# Patient Record
Sex: Male | Born: 2000 | Race: White | Hispanic: No | Marital: Married | Smoking: Current some day smoker
Health system: Southern US, Community
[De-identification: ages and names within clinical notes are randomized; demographics above are authoritative.]

## PROBLEM LIST (undated history)

## (undated) DIAGNOSIS — F419 Anxiety disorder, unspecified: Secondary | ICD-10-CM

## (undated) DIAGNOSIS — F319 Bipolar disorder, unspecified: Secondary | ICD-10-CM

## (undated) DIAGNOSIS — F909 Attention-deficit hyperactivity disorder, unspecified type: Secondary | ICD-10-CM

## (undated) DIAGNOSIS — F32A Depression, unspecified: Secondary | ICD-10-CM

## (undated) DIAGNOSIS — F988 Other specified behavioral and emotional disorders with onset usually occurring in childhood and adolescence: Secondary | ICD-10-CM

## (undated) HISTORY — DX: Depression, unspecified: F32.A

## (undated) HISTORY — DX: Bipolar disorder, unspecified: F31.9

## (undated) HISTORY — DX: Anxiety disorder, unspecified: F41.9

## (undated) HISTORY — DX: Attention-deficit hyperactivity disorder, unspecified type: F90.9

---
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
## 2021-11-04 ENCOUNTER — Encounter: Payer: Self-pay | Admitting: Family Medicine

## 2021-11-04 ENCOUNTER — Ambulatory Visit: Payer: BC Managed Care – PPO | Admitting: Family Medicine

## 2021-11-04 VITALS — BP 128/72 | HR 67 | Temp 99.4°F | Ht 68.0 in | Wt 145.8 lb

## 2021-11-04 DIAGNOSIS — Z0000 Encounter for general adult medical examination without abnormal findings: Secondary | ICD-10-CM

## 2021-11-04 DIAGNOSIS — Z0001 Encounter for general adult medical examination with abnormal findings: Secondary | ICD-10-CM | POA: Diagnosis not present

## 2021-11-04 DIAGNOSIS — F331 Major depressive disorder, recurrent, moderate: Secondary | ICD-10-CM | POA: Diagnosis not present

## 2021-11-04 MED ORDER — QUETIAPINE FUMARATE 25 MG PO TABS: ORAL_TABLET | ORAL | 0 refills | Status: DC
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
## 2021-11-05 ENCOUNTER — Telehealth: Payer: Self-pay | Admitting: Family Medicine

## 2021-11-05 NOTE — Telephone Encounter (Signed)
This request has received a Favorable outcome from [facility].  Please keep in mind this is not a guarantee of payment. Eligibility and Benefit determinations will be made at the time of service.  Please note any additional information provided by [facility] at the bottom of the screen. 

## 2021-11-22 ENCOUNTER — Telehealth: Payer: Self-pay | Admitting: Family Medicine

## 2021-11-22 NOTE — Telephone Encounter (Signed)
°  Prescription Request  11/22/2021  Is this a "Controlled Substance" medicine? yes  Have you seen your PCP in the last 2 weeks? 11/04/2021  If YES, route message to pool  -  If NO, patient needs to be scheduled for appointment.  What is the name of the medication or equipment? Pt said that dr stacks started him on seroquil 25mg  and was supposed to increase to 150 mg at next refill. He was suppose to try for 3 weeks and it is working  Have you contacted your pharmacy to request a refill? yes   Which pharmacy would you like this sent to? [facility]   Patient notified that their request is being sent to the clinical staff for review and that they should receive a response within 2 business days.

## 2021-11-24 ENCOUNTER — Ambulatory Visit: Payer: Self-pay | Admitting: Family Medicine

## 2021-11-25 ENCOUNTER — Ambulatory Visit: Payer: BC Managed Care – PPO | Admitting: Family Medicine

## 2021-11-26 ENCOUNTER — Encounter: Payer: Self-pay | Admitting: Family Medicine

## 2021-11-26 ENCOUNTER — Telehealth: Payer: Self-pay | Admitting: Family Medicine

## 2021-11-26 NOTE — Telephone Encounter (Signed)
Pt called in to dispute his appointment date.  Says he was supposed to come in on the 3rd of February.   Phone call was recorded and we listened back to the date the call was made and patient was told to come in on Thursday, Feb 2nd. Patient called back again and made another appt for next week.

## 2021-11-29 ENCOUNTER — Ambulatory Visit: Payer: Self-pay | Admitting: Family Medicine

## 2021-11-29 ENCOUNTER — Encounter: Payer: Self-pay | Admitting: Family Medicine

## 2021-11-29 VITALS — BP 128/70 | HR 84 | Temp 98.6°F | Ht 68.0 in | Wt 154.8 lb

## 2021-11-29 DIAGNOSIS — F902 Attention-deficit hyperactivity disorder, combined type: Secondary | ICD-10-CM

## 2021-11-29 DIAGNOSIS — F331 Major depressive disorder, recurrent, moderate: Secondary | ICD-10-CM

## 2021-11-29 MED ORDER — LISDEXAMFETAMINE DIMESYLATE 20 MG PO CAPS: 20.0000 mg | ORAL_CAPSULE | ORAL | 0 refills | Status: DC

## 2021-11-29 MED ORDER — QUETIAPINE FUMARATE 200 MG PO TABS
200.0000 mg | ORAL_TABLET | Freq: Every day | ORAL | 3 refills | Status: DC
Start: 2021-11-29 — End: 2024-08-21

## 2021-11-29 NOTE — Progress Notes (Signed)
Subjective:  Patient ID: Drew Robertson, male    DOB: [birth year]  Age: 21 y.o. MRN: [id number]  CC: Medication Refill   HPI PIER BOSHER presents for follow up of depression. Handling things better. No acting out. Wife kicked him out 2 weeks ago. He didn't react impulsively like he would have in the past. Four days ago had conflict at [facility]. Handled it without conflict. Unusual for him.   Still easily distracted and hyperactive. At work can be distracted by activity around him. Has fidgety behaviors. Had taken med for ADHD as a child.   Depression screen [employer] 2/9 11/29/2021 11/29/2021 11/04/2021  Decreased Interest 1 0 1  Down, Depressed, Hopeless 0 0 1  PHQ - 2 Score 1 0 2  Altered sleeping 0 - 1  Tired, decreased energy 0 - 1  Change in appetite 0 - 3  Feeling bad or failure about yourself  0 - 1  Trouble concentrating 3 - 3  Moving slowly or fidgety/restless 3 - 3  Suicidal thoughts 0 - 0  PHQ-9 Score 7 - 14  Difficult doing work/chores Somewhat difficult - Very difficult    History Kleber has a past medical history of ADD (attention deficit disorder), ADHD, Anxiety, Bipolar depression (HCC), and Depression.   He has no past surgical history on file.   His family history includes Anxiety disorder in his father, mother, sister, and sister; Cancer in his paternal grandfather; Dementia in his paternal grandmother; Depression in his father, mother, sister, and sister; Leukemia in his paternal grandmother.He reports that he has been smoking cigarettes. He has been smoking an average of .5 packs per day. He has never used smokeless tobacco. He reports current alcohol use. He reports current drug use. Drug: Marijuana.    ROS Review of Systems  Constitutional:  Negative for fever.  Respiratory:  Negative for shortness of breath.   Cardiovascular:  Negative for chest pain.  Musculoskeletal:  Negative for arthralgias.  Skin:  Negative for rash.   Objective:  BP  128/70    Pulse 84    Temp 98.6 F (37 C)    Ht 5\' 8"  (1.727 m)    Wt 154 lb 12.8 oz (70.2 kg)    SpO2 99%    BMI 23.54 kg/m   BP Readings from Last 3 Encounters:  11/29/21 128/70  11/04/21 128/72  04/25/16 98/62 (3 %, Z = -1.88 /  28 %, Z = -0.58)*   *BP percentiles are based on the 2017 AAP Clinical Practice Guideline for boys    Wt Readings from Last 3 Encounters:  11/29/21 154 lb 12.8 oz (70.2 kg)  11/04/21 145 lb 12.8 oz (66.1 kg)  04/22/16 135 lb (61.2 kg) (34 %, Z= -0.42)*   * Growth percentiles are based on CDC (Boys, 2-20 Years) data.     Physical Exam Vitals reviewed.  Constitutional:      Appearance: He is well-developed.  HENT:     Head: Normocephalic and atraumatic.     Right Ear: External ear normal.     Left Ear: External ear normal.     Mouth/Throat:     Pharynx: No oropharyngeal exudate or posterior oropharyngeal erythema.  Eyes:     Pupils: Pupils are equal, round, and reactive to light.  Cardiovascular:     Rate and Rhythm: Normal rate and regular rhythm.     Heart sounds: No murmur heard. Pulmonary:     Effort: No respiratory distress.  Breath sounds: Normal breath sounds.  Musculoskeletal:     Cervical back: Normal range of motion and neck supple.  Neurological:     Mental Status: He is alert and oriented to person, place, and time.      Assessment & Plan:   Tacuma was seen today for medication refill.  Diagnoses and all orders for this visit:  Moderate episode of recurrent major depressive disorder (HCC)  Attention deficit hyperactivity disorder (ADHD), combined type  Other orders -     QUEtiapine (SEROQUEL) 200 MG tablet; Take 1 tablet (200 mg total) by mouth at bedtime. -     lisdexamfetamine (VYVANSE) 20 MG capsule; Take 1 capsule (20 mg total) by mouth every morning.       I have changed Jude B. Pizzimenti "Blaine"'s QUEtiapine. I am also having him start on lisdexamfetamine.  Allergies as of 11/29/2021        Reactions   Concerta [methylphenidate]    Strattera [atomoxetine]    Zoloft [sertraline]         Medication List        Accurate as of November 29, 2021  1:35 PM. If you have any questions, ask your nurse or doctor.          lisdexamfetamine 20 MG capsule Commonly known as: Vyvanse Take 1 capsule (20 mg total) by mouth every morning. Started by: Mechele Claude, MD   QUEtiapine 200 MG tablet Commonly known as: SEROQUEL Take 1 tablet (200 mg total) by mouth at bedtime. What changed:  medication strength See the new instructions. Changed by: Mechele Claude, MD         Follow-up: Return in about 1 month (around 12/27/2021).  Mechele Claude, M.D.

## 2021-11-30 ENCOUNTER — Telehealth: Payer: Self-pay | Admitting: Family Medicine

## 2021-11-30 ENCOUNTER — Ambulatory Visit: Payer: BC Managed Care – PPO | Admitting: Family Medicine

## 2021-11-30 NOTE — Telephone Encounter (Signed)
This is a controlled medication and will have to wait for Dr. Darlyn Read.

## 2021-12-03 ENCOUNTER — Other Ambulatory Visit: Payer: Self-pay | Admitting: Family Medicine

## 2021-12-03 MED ORDER — METHYLPHENIDATE HCL 10 MG PO TABS: 10.0000 mg | ORAL_TABLET | Freq: Two times a day (BID) | ORAL | 0 refills | Status: DC

## 2021-12-03 NOTE — Telephone Encounter (Signed)
Patient aware.

## 2021-12-03 NOTE — Telephone Encounter (Signed)
Please let the patient know that I sent their prescription to their pharmacy. Thanks, WS 

## 2021-12-27 ENCOUNTER — Ambulatory Visit: Payer: Self-pay | Admitting: Family Medicine

## 2021-12-28 ENCOUNTER — Encounter: Payer: Self-pay | Admitting: Family Medicine

## 2022-11-11 ENCOUNTER — Ambulatory Visit (INDEPENDENT_AMBULATORY_CARE_PROVIDER_SITE_OTHER): Payer: 59 | Admitting: Family

## 2022-11-11 ENCOUNTER — Encounter: Payer: Self-pay | Admitting: Family

## 2022-11-11 VITALS — BP 129/78 | HR 64 | Temp 97.9°F | Ht 69.0 in | Wt 160.4 lb

## 2022-11-11 DIAGNOSIS — M6740 Ganglion, unspecified site: Secondary | ICD-10-CM

## 2022-11-11 MED ORDER — DICLOFENAC SODIUM 75 MG PO TBEC: 75.0000 mg | DELAYED_RELEASE_TABLET | Freq: Two times a day (BID) | ORAL | 0 refills | Status: DC

## 2022-11-11 NOTE — Patient Instructions (Signed)
Ganglion Cyst  A ganglion cyst is a non-cancerous, fluid-filled lump of tissue that occurs near a joint, tendon, or ligament. The cyst grows out of a joint or the lining of a tendon or ligament. Ganglion cysts most often develop in the hand or wrist, but they can also develop in the shoulder, elbow, hip, knee, ankle, or foot. Ganglion cysts are ball-shaped or egg-shaped. Their size can range from the size of a pea to larger than a grape. Increased activity may cause the cyst to get bigger because more fluid starts to build up. What are the causes? The exact cause of this condition is not known, but it may be related to: Inflammation or irritation around the joint. An injury or tear in the layers of tissue around the joint (joint capsule). Repetitive movements or overuse. History of acute or repeated injury. What increases the risk? You are more likely to develop this condition if: You are a male. You are 74-36 years old. What are the signs or symptoms? The main symptom of this condition is a lump. It most often appears on the hand or wrist. In many cases, there are no other symptoms, but a cyst can sometimes cause: Tingling. Pain or tenderness. Numbness. Weakness or loss of strength in the affected joint. Decreased range of motion in the affected area of the body. How is this diagnosed? Ganglion cysts are usually diagnosed based on a physical exam. Your health care provider will feel the lump and may shine a light next to it. If it is a ganglion cyst, the light will likely shine through it. Your health care provider may order an X-ray, ultrasound, MRI, or CT scan to rule out other conditions. How is this treated? Ganglion cysts often go away on their own without treatment. If you have pain or other symptoms, treatment may be needed. Treatment is also needed if the ganglion cyst limits your movement or if it gets infected. Treatment may include: Wearing a brace or splint on your wrist or  finger. Taking anti-inflammatory medicine. Having fluid drained from the lump with a needle (aspiration). Getting an injection of medicine into the joint to decrease inflammation. This may be corticosteroids, ethanol, or hyaluronidase. Having surgery to remove the ganglion cyst. Placing a pad in your shoe or wearing shoes that will not rub against the cyst if it is on your foot. Follow these instructions at home: Do not press on the ganglion cyst, poke it with a needle, or hit it. Take over-the-counter and prescription medicines only as told by your health care provider. If you have a brace or splint: Wear it as told by your health care provider. Remove it as told by your health care provider. Ask if you need to remove it when you take a shower or a bath. Watch your ganglion cyst for any changes. Keep all follow-up visits as told by your health care provider. This is important. Contact a health care provider if: Your ganglion cyst becomes larger or more painful. You have pus coming from the lump. You have weakness or numbness in the affected area. You have a fever or chills. Get help right away if: You have a fever and have any of these in the cyst area: Increased redness. Red streaks. Swelling. Summary A ganglion cyst is a non-cancerous, fluid-filled lump that occurs near a joint, tendon, or ligament. Ganglion cysts most often develop in the hand or wrist, but they can also develop in the shoulder, elbow, hip, knee, ankle, or  foot. Ganglion cysts often go away on their own without treatment. This information is not intended to replace advice given to you by your health care provider. Make sure you discuss any questions you have with your health care provider. Document Revised: 01/01/2020 Document Reviewed: 01/01/2020 Elsevier Patient Education  [place].

## 2022-11-11 NOTE — Progress Notes (Signed)
   Subjective:    Patient ID: Drew Robertson, male    DOB: 13-Jan-2001, 22 y.o.   MRN: [id number]  Chief Complaint  Patient presents with   Cyst    Right arm noticed about 2 weeks. No injury. Tender to touch     HPI Pt presents to the office today with a cyst on his right wrist that he noticed three weeks ago. Denies any pain, but mild tenderness when he pushes or moves it around. Denies any injury, fever, or erythemas.   Report he believes it has become slightly larger.    Review of Systems  All other systems reviewed and are negative.      Objective:   Physical Exam Vitals reviewed.  Constitutional:      General: He is not in acute distress.    Appearance: He is well-developed.  HENT:     Head: Normocephalic.  Eyes:     General:        Right eye: No discharge.        Left eye: No discharge.     Pupils: Pupils are equal, round, and reactive to light.  Neck:     Thyroid: No thyromegaly.  Cardiovascular:     Rate and Rhythm: Normal rate and regular rhythm.     Heart sounds: Normal heart sounds. No murmur heard. Pulmonary:     Effort: Pulmonary effort is normal. No respiratory distress.     Breath sounds: Normal breath sounds. No wheezing.  Abdominal:     General: Bowel sounds are normal. There is no distension.     Palpations: Abdomen is soft.     Tenderness: There is no abdominal tenderness.  Musculoskeletal:        General: No tenderness. Normal range of motion.       Arms:     Cervical back: Normal range of motion and neck supple.     Comments: Small movable cyst on right wrist   Skin:    General: Skin is warm and dry.     Findings: No erythema or rash.  Neurological:     Mental Status: He is alert and oriented to person, place, and time.     Cranial Nerves: No cranial nerve deficit.     Deep Tendon Reflexes: Reflexes are normal and symmetric.  Psychiatric:        Behavior: Behavior normal.        Thought Content: Thought content normal.         Judgment: Judgment normal.     BP 129/78   Pulse 64   Temp 97.9 F (36.6 C) (Temporal)   Ht 5\' 9"  (1.753 m)   Wt 160 lb 6.4 oz (72.8 kg)   SpO2 100%   BMI 23.69 kg/m        Assessment & Plan:  MUHAMED LUECKE comes in today with chief complaint of Cyst (Right arm noticed about 2 weeks. No injury. Tender to touch )   Diagnosis and orders addressed:  1. Ganglion cyst Rest Start Diclofenac BID with food Wear splint  No other NSAID's  Follow up if symptoms worsen or do not improve  - diclofenac (VOLTAREN) 75 MG EC tablet; Take 1 tablet (75 mg total) by mouth 2 (two) times daily.  Dispense: 30 tablet; Refill: 0  Evelina Dun, FNP

## 2024-08-20 ENCOUNTER — Ambulatory Visit: Payer: Self-pay

## 2024-08-20 NOTE — Telephone Encounter (Signed)
 FYI Only or Action Required?: FYI only for provider.  Patient was last seen in primary care on 11/11/2022 by Lavell Bari LABOR, FNP.  Called Nurse Triage reporting Testicle Pain.  Symptoms began yesterday.  Interventions attempted: Nothing.  Symptoms are: unchanged.  Triage Disposition: See Physician Within 24 Hours  Patient/caregiver understands and will follow disposition?: Yes, will follow disposition  Copied from CRM #[id number]. Topic: Clinical - Red Word Triage >> Aug 20, 2024 11:19 AM Roselie BROCKS wrote: [facility] that prompted transfer to Nurse Triage: Patient having breathing problems and feels like fluid in this lungs, also Patient states his left testicle if very tender and when he barely touches it ,it sending sharp pains all  the way up to his stomach Reason for Disposition  [1] MILD to MODERATE pain AND [2] comes and goes (intermittent; brief episodes less than one hour long) AND [3] present > 24 hours  Answer Assessment - Initial Assessment Questions 1. LOCATION and RADIATION: Where is the pain located?      L testicle pain, states started yesterday,  2. QUALITY: What does the pain feel like?  (e.g., sharp, dull, aching, burning)     sharp 3. SEVERITY: How bad is the pain?  (Scale 1-10; or mild, moderate, severe)     5  4. ONSET: When did the pain start?     yesterday 5. PATTERN: Does it come and go, or has it been constant since it started?     On palpation 6. SCROTAL APPEARANCE: What does the scrotum look like? Is there any swelling or redness?      Denies redness, denies swelling,  7. HERNIA: Has a doctor ever told you that you have a hernia?     Denies, but does do heavy lifting at work 8. OTHER SYMPTOMS: Do you have any other symptoms? (e.g., abdomen pain, difficulty passing urine, fever, vomiting)     Denies GU s/s.  Pt states he would also like to have the doctor check his lungs. Pt states that he is smoker and has been having difficulty  breathing at times for at least 2 months. Pt speaking in full sentences at time of call.  Protocols used: Scrotum Pain-A-AH

## 2024-08-20 NOTE — Telephone Encounter (Signed)
 Appt made.

## 2024-08-21 ENCOUNTER — Ambulatory Visit (INDEPENDENT_AMBULATORY_CARE_PROVIDER_SITE_OTHER): Payer: Self-pay | Admitting: Family Medicine

## 2024-08-21 ENCOUNTER — Ambulatory Visit (HOSPITAL_COMMUNITY)
Admission: RE | Admit: 2024-08-21 | Discharge: 2024-08-21 | Disposition: A | Discharge status: Home / Self Care | Source: Ambulatory Visit | Attending: Family Medicine | Admitting: Family Medicine

## 2024-08-21 ENCOUNTER — Encounter: Payer: Self-pay | Admitting: Family Medicine

## 2024-08-21 VITALS — BP 130/79 | HR 78 | Temp 98.1°F | Ht 69.0 in | Wt 148.0 lb

## 2024-08-21 DIAGNOSIS — R1032 Left lower quadrant pain: Secondary | ICD-10-CM

## 2024-08-21 DIAGNOSIS — N50812 Left testicular pain: Secondary | ICD-10-CM | POA: Diagnosis present

## 2024-08-21 DIAGNOSIS — J4520 Mild intermittent asthma, uncomplicated: Secondary | ICD-10-CM

## 2024-08-21 LAB — URINALYSIS, COMPLETE
Bilirubin, UA: NEGATIVE
Glucose, UA: NEGATIVE
Ketones, UA: NEGATIVE
Leukocytes,UA: NEGATIVE
Nitrite, UA: NEGATIVE
Protein,UA: NEGATIVE
RBC, UA: NEGATIVE
Specific Gravity, UA: 1.015 (ref 1.005–1.030)
Urobilinogen, Ur: 0.2 mg/dL (ref 0.2–1.0)
pH, UA: 6.5 (ref 5.0–7.5)

## 2024-08-21 LAB — MICROSCOPIC EXAMINATION
Bacteria, UA: NONE SEEN
Epithelial Cells (non renal): NONE SEEN /HPF (ref 0–10)
RBC, Urine: NONE SEEN /HPF (ref 0–2)
Renal Epithel, UA: NONE SEEN /HPF
WBC, UA: NONE SEEN /HPF (ref 0–5)
Yeast, UA: NONE SEEN

## 2024-08-21 NOTE — Progress Notes (Signed)
 BP 130/79   Pulse 78   Temp 98.1 F (36.7 C)   Ht 5' 9 (1.753 m)   Wt 148 lb (67.1 kg)   SpO2 98%   BMI 21.86 kg/m    Subjective:   Patient ID: Drew Robertson, male    DOB: 24-May-2001, 23 y.o.   MRN: [id number]  HPI: Drew Robertson is a 23 y.o. male presenting on 08/21/2024 for Shortness of Breath and Testicle Pain   Discussed the use of AI scribe software for clinical note transcription with the patient, who gave verbal consent to proceed.  History of Present Illness   Drew Robertson is a 23 year old male who presents with testicular pain.  Testicular pain - Onset 2-3 days ago while getting into a vehicle - Pain localized to the left testicle - Characterized as shooting pain, intermittent, and occurs with pressure on the left testicle - No pain during sexual activity; has not engaged in intercourse since symptom onset - No associated urinary symptoms including dysuria, hematuria, or urinary frequency - No associated bowel issues, nausea, vomiting, or abdominal pain - No visible bulges noted - Recent history of moving furniture over the weekend, suspects possible relation to symptom onset  Respiratory and chest symptoms - Experiences congestion with sensation of pressure, similar to previous episodes of pneumonia - Pressure most noticeable with deep inspiration - Reports increased shortness of breath recently - No history of asthma - Experiences tightness in chest when inhaling smoke  Tobacco use - History of heavy smoking during [employer] service - Transitioned to vaping, then resumed smoking half a pack to one pack per day - Recently reduced smoking and switched to smokeless tobacco          Relevant past medical, surgical, family and social history reviewed and updated as indicated. Interim medical history since our last visit reviewed. Allergies and medications reviewed and updated.  Review of Systems  Constitutional:  Negative for  chills and fever.  HENT:  Positive for congestion.   Respiratory:  Positive for shortness of breath. Negative for wheezing.   Cardiovascular:  Negative for chest pain and leg swelling.  Gastrointestinal:  Negative for abdominal pain, diarrhea and nausea.  Genitourinary:  Positive for testicular pain. Negative for dysuria, genital sores, hematuria and scrotal swelling.  Musculoskeletal:  Negative for back pain and gait problem.  Skin:  Negative for rash.  All other systems reviewed and are negative.   Per HPI unless specifically indicated above   Allergies as of 08/21/2024       Reactions   Concerta  [methylphenidate ]    Strattera [atomoxetine]    Zoloft [sertraline]         Medication List        Accurate as of August 21, 2024 11:14 AM. If you have any questions, ask your nurse or doctor.          STOP taking these medications    diclofenac  75 MG EC tablet Commonly known as: VOLTAREN  Stopped by: Fonda LABOR Jacklin Zwick   methylphenidate  10 MG tablet Commonly known as: RITALIN  Stopped by: Fonda LABOR Athena Baltz   QUEtiapine  200 MG tablet Commonly known as: SEROQUEL  Stopped by: Fonda LABOR Isrrael Fluckiger         Objective:   BP 130/79   Pulse 78   Temp 98.1 F (36.7 C)   Ht 5' 9 (1.753 m)   Wt 148 lb (67.1 kg)   SpO2 98%   BMI 21.86 kg/m  Wt Readings from Last 3 Encounters:  08/21/24 148 lb (67.1 kg)  11/11/22 160 lb 6.4 oz (72.8 kg)  11/29/21 154 lb 12.8 oz (70.2 kg)    Physical Exam Physical Exam   CHEST: Lungs clear to auscultation bilaterally. GENITOURINARY: Pain and slight bulge in left mons pubis region above left testicle and scrotum.         Assessment & Plan:   Problem List Items Addressed This Visit   None Visit Diagnoses       Testicular pain, left    -  Primary   Relevant Orders   US  SCROTUM W/DOPPLER   Urinalysis, Complete   Ct Ng M genitalium NAA, Urine     Left groin pain       Relevant Orders   US  SCROTUM W/DOPPLER    Urinalysis, Complete   Ct Ng M genitalium NAA, Urine     Mild intermittent reactive airway disease without complication               Left testicular pain Intermittent pain exacerbated by pressure. Differential includes groin strain, hernia, or spermatic cord issues. Ultrasound needed to rule out hernia or abnormalities. - Order ultrasound of scrotum, testes, spermatic cord, and surrounding structures.  Reactive airway symptoms Intermittent chest tightness and burning likely due to smoking. Lungs clear. Symptoms may improve with smoking cessation. - Advise smoking cessation. - Consider albuterol inhaler if symptoms worsen.          Follow up plan: Return if symptoms worsen or fail to improve.  Counseling provided for all of the vaccine components Orders Placed This Encounter  Procedures   US  SCROTUM W/DOPPLER   Urinalysis, Complete   Ct Ng M genitalium NAA, Urine    Fonda Levins, MD [facility] 08/21/2024, 11:14 AM

## 2024-08-22 LAB — CT NG M GENITALIUM NAA, URINE
Chlamydia trachomatis, NAA: NEGATIVE
Mycoplasma genitalium NAA: NEGATIVE
Neisseria gonorrhoeae, NAA: NEGATIVE

## 2024-08-23 ENCOUNTER — Ambulatory Visit: Payer: Self-pay | Admitting: Family Medicine

## 2024-08-23 NOTE — Telephone Encounter (Signed)
 Patient aware and verbalized understanding.
